# Patient Record
Sex: Male | Born: 1990 | Hispanic: No | Marital: Single | Smoking: Never smoker
Health system: Southern US, Community
[De-identification: ages and names within clinical notes are randomized; demographics above are authoritative.]

## PROBLEM LIST (undated history)

## (undated) DIAGNOSIS — J45909 Unspecified asthma, uncomplicated: Secondary | ICD-10-CM

## (undated) DIAGNOSIS — E669 Obesity, unspecified: Secondary | ICD-10-CM

## (undated) HISTORY — PX: TYMPANOSTOMY TUBE PLACEMENT: SHX32

## (undated) HISTORY — DX: Obesity, unspecified: E66.9

## (undated) HISTORY — DX: Unspecified asthma, uncomplicated: J45.909

## (undated) HISTORY — PX: NASAL SINUS SURGERY: SHX719

---

## 2015-03-15 ENCOUNTER — Ambulatory Visit
Admission: RE | Admit: 2015-03-15 | Discharge: 2015-03-15 | Disposition: A | Discharge status: Home / Self Care | Payer: 59 | Source: Ambulatory Visit | Attending: Internal Medicine | Admitting: Internal Medicine

## 2015-03-15 ENCOUNTER — Telehealth: Payer: Self-pay | Admitting: *Deleted

## 2015-03-15 ENCOUNTER — Encounter: Payer: Self-pay | Admitting: Internal Medicine

## 2015-03-15 ENCOUNTER — Ambulatory Visit (INDEPENDENT_AMBULATORY_CARE_PROVIDER_SITE_OTHER): Payer: 59 | Admitting: Internal Medicine

## 2015-03-15 VITALS — BP 126/84 | HR 103 | Temp 98.1°F | Ht 74.0 in | Wt 278.0 lb

## 2015-03-15 DIAGNOSIS — Z0000 Encounter for general adult medical examination without abnormal findings: Secondary | ICD-10-CM

## 2015-03-15 DIAGNOSIS — R079 Chest pain, unspecified: Secondary | ICD-10-CM

## 2015-03-15 DIAGNOSIS — E669 Obesity, unspecified: Secondary | ICD-10-CM

## 2015-03-15 DIAGNOSIS — L301 Dyshidrosis [pompholyx]: Secondary | ICD-10-CM | POA: Diagnosis not present

## 2015-03-15 DIAGNOSIS — Z13220 Encounter for screening for lipoid disorders: Secondary | ICD-10-CM | POA: Diagnosis not present

## 2015-03-15 DIAGNOSIS — R5383 Other fatigue: Secondary | ICD-10-CM | POA: Diagnosis not present

## 2015-03-15 DIAGNOSIS — J45990 Exercise induced bronchospasm: Secondary | ICD-10-CM

## 2015-03-15 LAB — CBC WITH DIFFERENTIAL/PLATELET
BASOS ABS: 0.1 10*3/uL (ref 0.0–0.1)
BASOS PCT: 1 % (ref 0–1)
EOS PCT: 3 % (ref 0–5)
Eosinophils Absolute: 0.2 10*3/uL (ref 0.0–0.7)
HEMATOCRIT: 47.1 % (ref 39.0–52.0)
Hemoglobin: 16.1 g/dL (ref 13.0–17.0)
LYMPHS PCT: 32 % (ref 12–46)
Lymphs Abs: 2.6 10*3/uL (ref 0.7–4.0)
MCH: 29 pg (ref 26.0–34.0)
MCHC: 34.2 g/dL (ref 30.0–36.0)
MCV: 84.9 fL (ref 78.0–100.0)
MPV: 10.5 fL (ref 8.6–12.4)
Monocytes Absolute: 0.5 10*3/uL (ref 0.1–1.0)
Monocytes Relative: 6 % (ref 3–12)
NEUTROS ABS: 4.8 10*3/uL (ref 1.7–7.7)
Neutrophils Relative %: 58 % (ref 43–77)
Platelets: 268 10*3/uL (ref 150–400)
RBC: 5.55 MIL/uL (ref 4.22–5.81)
RDW: 14.1 % (ref 11.5–15.5)
WBC: 8.2 10*3/uL (ref 4.0–10.5)

## 2015-03-15 LAB — COMPLETE METABOLIC PANEL WITH GFR
ALT: 87 U/L — AB (ref 9–46)
AST: 42 U/L — AB (ref 10–40)
Albumin: 4.9 g/dL (ref 3.6–5.1)
Alkaline Phosphatase: 78 U/L (ref 40–115)
BILIRUBIN TOTAL: 1 mg/dL (ref 0.2–1.2)
BUN: 12 mg/dL (ref 7–25)
CALCIUM: 9.7 mg/dL (ref 8.6–10.3)
CO2: 28 mmol/L (ref 20–31)
CREATININE: 0.96 mg/dL (ref 0.60–1.35)
Chloride: 100 mmol/L (ref 98–110)
GFR, Est Non African American: >89 mL/min (ref >=60)
Glucose, Bld: 81 mg/dL (ref 65–99)
Potassium: 4.3 mmol/L (ref 3.5–5.3)
SODIUM: 139 mmol/L (ref 135–146)
TOTAL PROTEIN: 8.1 g/dL (ref 6.1–8.1)

## 2015-03-15 LAB — CHOLESTEROL, TOTAL: CHOLESTEROL: 209 mg/dL — AB (ref 125–200)

## 2015-03-15 MED ORDER — FLUTICASONE-SALMETEROL 100-50 MCG/DOSE IN AEPB: 1.0000 | INHALATION_SPRAY | Freq: Two times a day (BID) | RESPIRATORY_TRACT | Status: AC

## 2015-03-15 MED ORDER — ALBUTEROL SULFATE HFA 108 (90 BASE) MCG/ACT IN AERS: 2.0000 | INHALATION_SPRAY | Freq: Four times a day (QID) | RESPIRATORY_TRACT | Status: AC | PRN

## 2015-03-15 NOTE — Patient Instructions (Signed)
Labs are pending. No driving evidence until reevaluated in 2 weeks here. Neurology consultation. 24-hour Holter monitor. Labs are pending. Have refilled albuterol and Advair inhalers.
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
## 2015-03-15 NOTE — Telephone Encounter (Signed)
Patient scheduled for 24 hour Holter monitor Tues august 23,2016 at 2 PM at [address] 3rd floor

## 2015-03-16 LAB — DRUG SCREEN, URINE
Amphetamine Screen, Ur: NEGATIVE
BARBITURATE QUANT UR: NEGATIVE
BENZODIAZEPINES.: NEGATIVE
Cocaine Metabolites: NEGATIVE
Creatinine,U: 239.6 mg/dL
METHADONE: NEGATIVE
Marijuana Metabolite: NEGATIVE
Opiates: NEGATIVE
Phencyclidine (PCP): NEGATIVE
Propoxyphene: NEGATIVE

## 2015-03-16 LAB — TSH: TSH: 3.364 u[IU]/mL (ref 0.350–4.500)

## 2015-03-16 LAB — T4, FREE: FREE T4: 1.02 ng/dL (ref 0.80–1.80)

## 2015-03-18 ENCOUNTER — Telehealth: Payer: Self-pay | Admitting: *Deleted

## 2015-03-18 ENCOUNTER — Other Ambulatory Visit: Payer: Self-pay | Admitting: Internal Medicine

## 2015-03-18 DIAGNOSIS — R5383 Other fatigue: Secondary | ICD-10-CM

## 2015-03-18 DIAGNOSIS — R002 Palpitations: Secondary | ICD-10-CM

## 2015-03-18 DIAGNOSIS — R079 Chest pain, unspecified: Secondary | ICD-10-CM

## 2015-03-18 NOTE — Telephone Encounter (Signed)
Reviewed lab results with patient . He will call back and schedule for Fasting Lipid Panel and also an office visit in 6 weeks with Liver Panel after he checks his work schedule.

## 2015-03-19 ENCOUNTER — Ambulatory Visit (INDEPENDENT_AMBULATORY_CARE_PROVIDER_SITE_OTHER): Payer: 59

## 2015-03-19 DIAGNOSIS — R079 Chest pain, unspecified: Secondary | ICD-10-CM

## 2015-03-19 DIAGNOSIS — R5383 Other fatigue: Secondary | ICD-10-CM | POA: Diagnosis not present

## 2015-03-19 DIAGNOSIS — R002 Palpitations: Secondary | ICD-10-CM | POA: Diagnosis not present

## 2015-03-29 ENCOUNTER — Ambulatory Visit (INDEPENDENT_AMBULATORY_CARE_PROVIDER_SITE_OTHER): Payer: 59 | Admitting: Internal Medicine

## 2015-03-29 ENCOUNTER — Encounter: Payer: Self-pay | Admitting: Internal Medicine

## 2015-03-29 VITALS — BP 132/86 | HR 92 | Temp 98.0°F | Ht 74.0 in | Wt 278.0 lb

## 2015-03-29 DIAGNOSIS — R079 Chest pain, unspecified: Secondary | ICD-10-CM | POA: Diagnosis not present

## 2015-03-29 DIAGNOSIS — G4719 Other hypersomnia: Secondary | ICD-10-CM

## 2015-03-29 DIAGNOSIS — Z23 Encounter for immunization: Secondary | ICD-10-CM | POA: Diagnosis not present

## 2015-03-29 NOTE — Patient Instructions (Addendum)
To have neurology evaluation and cardiology evaluation. Return in 3 weeks after these evaluations. Holter monitor within normal limits. Will  have pulmonary evaluation with history of asthma.

## 2015-03-29 NOTE — Progress Notes (Signed)
   Subjective:    Patient ID: Jorge Pearson, male    DOB: May 19, 1991, 24 y.o.   MRN: [id number]  HPI Is here today to follow-up on excessive daytime sleepiness. 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 This is been stressful because he has no income at this point in time. 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 He had hoped to make this a permanent situation. He has appointments with cardiology and neurology schedule. He is having some issues with some chest discomfort. He has a history of asthma. Needs refill on albuterol and Symbicort inhalers.  Appointment to see Dr. Vickey Huger regarding excessive daytime sleepiness September 26. Appointment see cardiologist, Dr. Eden Emms September 27. No further episodes of drowsiness or perhaps drifting off to sleep while driving around town. 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 I see no issue as long as he is not driving. In any case he is out of work for the present time for his supervisors.    Review of Systems     Objective:   Physical Exam Neck is supple without JVD thyromegaly or carotid bruits. Chest clear to auscultation. Cardiac exam regular rate and rhythm normal S1 and S2. Extremity is without edema.       Assessment & Plan:  Excessive daytime sleepiness-sees neurologist September 26  History of asthma-inhalers refilled.  Chest discomfort-? Anxiety or reflux. Cardiology evaluation pending on September 27  Plan: Further recommendations to follow after these evaluations are completed.

## 2015-04-22 ENCOUNTER — Other Ambulatory Visit: Payer: 59 | Admitting: Internal Medicine

## 2015-04-22 ENCOUNTER — Institutional Professional Consult (permissible substitution): Payer: 59 | Admitting: Neurology

## 2015-04-22 ENCOUNTER — Telehealth: Payer: Self-pay

## 2015-04-22 ENCOUNTER — Telehealth: Payer: Self-pay | Admitting: Internal Medicine

## 2015-04-22 ENCOUNTER — Other Ambulatory Visit: Payer: Self-pay | Admitting: Internal Medicine

## 2015-04-22 DIAGNOSIS — R748 Abnormal levels of other serum enzymes: Secondary | ICD-10-CM

## 2015-04-22 LAB — HEPATIC FUNCTION PANEL
ALBUMIN: 4.6 g/dL (ref 3.6–5.1)
ALK PHOS: 64 U/L (ref 40–115)
ALT: 132 U/L — AB (ref 9–46)
AST: 76 U/L — AB (ref 10–40)
BILIRUBIN DIRECT: 0.2 mg/dL (ref <=0.2)
BILIRUBIN TOTAL: 1.4 mg/dL — AB (ref 0.2–1.2)
Indirect Bilirubin: 1.2 mg/dL (ref 0.2–1.2)
Total Protein: 7 g/dL (ref 6.1–8.1)

## 2015-04-22 NOTE — Telephone Encounter (Signed)
Pt did not show for their appt with Dr. Dohmeier today.  

## 2015-04-22 NOTE — Progress Notes (Signed)
Patient ID: Jorge Pearson, male   DOB: [birth year], 24 y.o.   MRN: [id number]     Cardiology Office Note   Date:  04/22/2015   ID:  Jorge Pearson, DOB 01/19/1991, MRN [id number]  PCP:  Margaree Mackintosh, MD  Cardiologist:   Charlton Haws, MD   No chief complaint on file.     History of Present Illness: Jorge Pearson is a 24 y.o. male who presents for evaluation of PVC's Reviewed holter from 03/18/15 and benign.  Had single PVC, 3 PAC;s  Minimum HR 58 maximum 139 and mean 92 bpm  Seen by Dr Lenord Fellers 03/29/15 mostly for excessive day time sleepiness. Not clear that this has anything to do with his heart.  Her note indicates something about chest pains but he has no history of heart issues. ? 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  Has appt with  Also has asthma and inhalers refilled  neurology.  He drinks occasional alcohol but denies excess.  Dr Lenord Fellers indicated his LFT;s were elevated. No history of needle sticks or hepatitis  He goes to bed around 9:00-10:00 and sleeps until 8:00-9:00  No sleep disorders Denies other drugs.    Denies toxic exposures or high CO2 environment  He does not think he is passing out or falling asleep while driving has no recollection of this has not gotten into an Accident.  No chest pain palpitations dyspnea.  No family history of cardiac issues just exercise induced asthma Which he shares with his brother     Past Medical History  Diagnosis Date  . Asthma   . Obesity     Past Surgical History  Procedure Laterality Date  . Nasal sinus surgery    . Tympanostomy tube placement       Current Outpatient Prescriptions  Medication Sig Dispense Refill  . albuterol (PROVENTIL HFA;VENTOLIN HFA) 108 (90 BASE) MCG/ACT inhaler Inhale 2 puffs into the lungs every 6 (six) hours as needed for wheezing or shortness of breath. 1 Inhaler 3  . Fluticasone-Salmeterol (ADVAIR) 100-50 MCG/DOSE AEPB Inhale 1  puff into the lungs 2 (two) times daily. 1 each 3   No current facility-administered medications for this visit.    Allergies:   Review of patient's allergies indicates no known allergies.    Social History:  The patient  reports that he has never smoked. He does not have any smokeless tobacco history on file.   Family History:  The patient's family history includes Hyperlipidemia in his father and mother; Hypertension in his father and mother.    ROS:  Please see the history of present illness.   Otherwise, review of systems are positive for none.   All other systems are reviewed and negative.    PHYSICAL EXAM: VS:  There were no vitals taken for this visit. , BMI There is no weight on file to calculate BMI. Affect appropriate Overweight white male with [place] accent  HEENT: normal Neck supple with no adenopathy JVP normal no bruits no thyromegaly Lungs clear with no wheezing and good diaphragmatic motion Heart:  S1/S2 no murmur, no rub, gallop or click PMI normal Abdomen: benighn, BS positve, no tenderness, no AAA no bruit.  No HSM or HJR Distal pulses intact with no bruits No edema Neuro non-focal Skin warm and dry No muscular weakness    EKG:   8/19  SR rate 78  QT 360  Normal ECG isolated  Q lead 3   Recent Labs: 03/15/2015: ALT 87*; BUN 12; Creat 0.96; Hemoglobin 16.1; Platelets 268; Potassium 4.3; Sodium 139; TSH 3.364    Lipid Panel    Component Value Date/Time   CHOL 209* 03/15/2015 1407      Wt Readings from Last 3 Encounters:  03/29/15 126.1 kg (278 lb)  03/15/15 126.1 kg (278 lb)      Other studies Reviewed: Additional studies/ records that were reviewed today include: Notes from Dr Beryle Quant office Holter monitor .    ASSESSMENT AND PLAN:  1.  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  holter benign with only a couple of PVC/PAC.  Has appt with neuro. No other signs of neurologic issues. Good sleep habits No drugs.  Consider trial of Provigil and will  likely need EEG 2. Asthma:  PRN inhaler no new allergies moving to [place] mild and exercise induced 3. PVC/PAC:  Given issues with working for EMS and "falling asleep"  Will order echo to r/o structural heart disease 4. Elevated LFT;s  F/u Dr Baird Kay  Will need further lab testing including hepatitis serology  Echo to make sure no passive congestion   Current medicines are reviewed at length with the patient today.  The patient does not have concerns regarding medicines.  The following changes have been made:  no change  Labs/ tests ordered today include: Echo   No orders of the defined types were placed in this encounter.     Disposition:   FU with me PRN     Signed, Charlton Haws, MD  04/22/2015 4:48 PM    [facility] [address] [place], [place], [place]  [address] Phone: [phone]; Fax: [phone]

## 2015-04-22 NOTE — Telephone Encounter (Signed)
He apparently did not go see Dr. Vickey Huger today. He has elevated liver functions that have increased since last measurement. We are going to pursue hepatitis titers and CT of the abdomen. He is to see Dr. Eden Emms tomorrow.  SGPT is higher than SGOT.  He says he forgot about a point with neurologist today. Says he does know about 10 with cardiologist tomorrow.  Says he had 2 beers on Friday evening September 23. Has not had any alcohol since that time and seldom drinks.  He will need to reschedule appointment with Dr. Vickey Huger.

## 2015-04-23 ENCOUNTER — Encounter: Payer: Self-pay | Admitting: Neurology

## 2015-04-23 ENCOUNTER — Encounter: Payer: Self-pay | Admitting: Cardiovascular Disease

## 2015-04-23 ENCOUNTER — Ambulatory Visit (INDEPENDENT_AMBULATORY_CARE_PROVIDER_SITE_OTHER): Payer: BLUE CROSS/BLUE SHIELD | Admitting: Cardiovascular Disease

## 2015-04-23 VITALS — BP 130/98 | HR 88 | Ht 74.0 in | Wt 314.4 lb

## 2015-04-23 DIAGNOSIS — R55 Syncope and collapse: Secondary | ICD-10-CM

## 2015-04-23 DIAGNOSIS — I493 Ventricular premature depolarization: Secondary | ICD-10-CM

## 2015-04-23 DIAGNOSIS — I491 Atrial premature depolarization: Secondary | ICD-10-CM

## 2015-04-23 LAB — HEPATITIS B SURFACE ANTIGEN: HEP B S AG: NEGATIVE

## 2015-04-23 LAB — LIPASE: LIPASE: 30 U/L (ref 7–60)

## 2015-04-23 LAB — AMYLASE: AMYLASE: 31 U/L (ref 0–105)

## 2015-04-23 LAB — HEPATITIS B CORE ANTIBODY, TOTAL: Hep B Core Total Ab: NONREACTIVE

## 2015-04-23 LAB — HEPATITIS C ANTIBODY: HCV AB: NEGATIVE

## 2015-04-23 LAB — HEPATITIS B SURFACE ANTIBODY,QUALITATIVE: HEP B S AB: POSITIVE — AB

## 2015-04-23 NOTE — Patient Instructions (Signed)
Medication Instructions:  Your physician recommends that you continue on your current medications as directed. Please refer to the Current Medication list given to you today.  Labwork: NONE  Testing/Procedures: Your physician has requested that you have an echocardiogram. Echocardiography is a painless test that uses sound waves to create images of your heart. It provides your doctor with information about the size and shape of your heart and how well your heart's chambers and valves are working. This procedure takes approximately one hour. There are no restrictions for this procedure.  Follow-Up: Your physician recommends that you schedule a follow-up appointment in: AS NEEDED  Any Other Special Instructions Will Be Listed Below (If Applicable). '

## 2015-04-26 ENCOUNTER — Telehealth: Payer: Self-pay | Admitting: *Deleted

## 2015-04-26 ENCOUNTER — Other Ambulatory Visit: Payer: Self-pay

## 2015-04-26 ENCOUNTER — Ambulatory Visit (HOSPITAL_COMMUNITY): Payer: 59 | Attending: Cardiovascular Disease

## 2015-04-26 DIAGNOSIS — I517 Cardiomegaly: Secondary | ICD-10-CM | POA: Diagnosis not present

## 2015-04-26 DIAGNOSIS — R748 Abnormal levels of other serum enzymes: Secondary | ICD-10-CM

## 2015-04-26 DIAGNOSIS — Z6841 Body Mass Index (BMI) 40.0 and over, adult: Secondary | ICD-10-CM | POA: Insufficient documentation

## 2015-04-26 DIAGNOSIS — R55 Syncope and collapse: Secondary | ICD-10-CM | POA: Diagnosis not present

## 2015-04-26 NOTE — Telephone Encounter (Signed)
Spoke with patient reviewed lab results. Unable to get CT scan approved by insurance so per Dr Lenord Fellers patient referred to GI for further evaluation. Reminded patient to reschedule appt with Dr Vickey Huger.

## 2015-05-02 ENCOUNTER — Other Ambulatory Visit: Payer: Self-pay | Admitting: *Deleted

## 2015-05-02 DIAGNOSIS — R931 Abnormal findings on diagnostic imaging of heart and coronary circulation: Secondary | ICD-10-CM

## 2015-05-06 ENCOUNTER — Encounter: Payer: Self-pay | Admitting: Cardiovascular Disease

## 2015-05-28 ENCOUNTER — Ambulatory Visit (HOSPITAL_COMMUNITY): Admission: RE | Admit: 2015-05-28 | Payer: 59 | Source: Ambulatory Visit

## 2015-06-03 ENCOUNTER — Institutional Professional Consult (permissible substitution): Payer: 59 | Admitting: Pulmonary Disease
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
